# Patient Record
Sex: Male | Born: 1995 | Hispanic: Yes | Marital: Single | Smoking: Current every day smoker
Health system: Southern US, Community
[De-identification: ages and names within clinical notes are randomized; demographics above are authoritative.]

## PROBLEM LIST (undated history)

## (undated) DIAGNOSIS — J45909 Unspecified asthma, uncomplicated: Secondary | ICD-10-CM

## (undated) HISTORY — PX: TONSILLECTOMY: SUR1361

---
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
## 2017-04-26 ENCOUNTER — Encounter (HOSPITAL_COMMUNITY): Payer: Self-pay | Admitting: Emergency Medicine

## 2017-04-26 DIAGNOSIS — L03211 Cellulitis of face: Secondary | ICD-10-CM | POA: Insufficient documentation

## 2017-04-26 DIAGNOSIS — J45909 Unspecified asthma, uncomplicated: Secondary | ICD-10-CM | POA: Diagnosis not present

## 2017-04-26 DIAGNOSIS — F17200 Nicotine dependence, unspecified, uncomplicated: Secondary | ICD-10-CM | POA: Insufficient documentation

## 2017-04-26 DIAGNOSIS — R220 Localized swelling, mass and lump, head: Secondary | ICD-10-CM | POA: Diagnosis present

## 2017-04-26 NOTE — ED Triage Notes (Signed)
Pt from home with c/o swelling below left eye. Pt states swelling began Friday evening. Pt initially thought he was bitten by a mosquito. Pt denies changes in vision, fever , chills, or SOB. Pt denies pain or itching.

## 2017-04-27 ENCOUNTER — Emergency Department (HOSPITAL_COMMUNITY)
Admission: EM | Admit: 2017-04-27 | Discharge: 2017-04-27 | Disposition: A | Discharge status: Home / Self Care | Payer: Medicaid Other | Attending: Emergency Medicine | Admitting: Emergency Medicine

## 2017-04-27 DIAGNOSIS — L03211 Cellulitis of face: Secondary | ICD-10-CM

## 2017-04-27 DIAGNOSIS — R220 Localized swelling, mass and lump, head: Secondary | ICD-10-CM

## 2017-04-27 HISTORY — DX: Unspecified asthma, uncomplicated: J45.909

## 2017-04-27 MED ORDER — TETANUS-DIPHTH-ACELL PERTUSSIS 5-2.5-18.5 LF-MCG/0.5 IM SUSP
0.5000 mL | Freq: Once | INTRAMUSCULAR | Status: AC
  Administered 2017-04-27: 0.5 mL via INTRAMUSCULAR
  Filled 2017-04-27: qty 0.5

## 2017-04-27 MED ORDER — SULFAMETHOXAZOLE-TRIMETHOPRIM 800-160 MG PO TABS: 1.0000 | ORAL_TABLET | Freq: Two times a day (BID) | ORAL | 0 refills | Status: AC

## 2017-04-27 MED ORDER — SULFAMETHOXAZOLE-TRIMETHOPRIM 800-160 MG PO TABS
1.0000 | ORAL_TABLET | Freq: Once | ORAL | Status: AC
  Administered 2017-04-27: 1 via ORAL
  Filled 2017-04-27: qty 1

## 2017-04-27 NOTE — ED Provider Notes (Signed)
[facility]-EMERGENCY DEPT Provider Note   CSN: [id number] Arrival date & time: 04/26/17  2258     History   Chief Complaint Chief Complaint  Patient presents with  . Facial Swelling    HPI James Steele is a 21 y.o. male with a hx of asthma presents to the Emergency Department complaining of gradual, persistent, progressively worsening Swelling and erythema under the left eye onset 3 days ago. Patient reports that he was outside and thought he had been bitten by a bug. He reports gradual increase in the symptoms. No fevers or chills, no pain with EOMs, no blurred vision or diplopia. Patient reports the swelling is localized to under the left eye does not extend around the lids. Patient denies history of IV drug use, immunocompromise or diabetes. He denies history of recurrent infections or MRSA. No treatments prior to arrival. Nothing seems to make the symptoms better or worse.  The history is provided by the patient and medical records. No language interpreter was used.    Past Medical History:  Diagnosis Date  . Asthma     There are no active problems to display for this patient.   Past Surgical History:  Procedure Laterality Date  . TONSILLECTOMY         Home Medications    Prior to Admission medications   Medication Sig Start Date End Date Taking? Authorizing Provider  sulfamethoxazole-trimethoprim (BACTRIM DS,SEPTRA DS) 800-160 MG tablet Take 1 tablet by mouth 2 (two) times daily. 04/27/17 05/04/17  Ardine Iacovelli, Dahlia ClientHannah, PA-C    Family History No family history on file.  Social History Social History  Substance Use Topics  . Smoking status: Current Every Day Smoker  . Smokeless tobacco: Never Used     Comment: 1-2 cigarettes/day  . Alcohol use No     Allergies   Mango flavor   Review of Systems Review of Systems  Constitutional: Negative for chills and fever.  Gastrointestinal: Negative for nausea and vomiting.  Endocrine: Negative for polydipsia,  polyphagia and polyuria.  Skin: Positive for color change.  Allergic/Immunologic: Negative for immunocompromised state.  Hematological: Does not bruise/bleed easily.  Psychiatric/Behavioral: The patient is not nervous/anxious.      Physical Exam Updated Vital Signs BP (!) 142/69 (BP Location: Left Arm)   Pulse 85   Temp 99 F (37.2 C) (Oral)   Resp 16   Ht 6' (1.829 m)   Wt 90.7 kg (200 lb)   SpO2 98%   BMI 27.12 kg/m   Physical Exam  Constitutional: He is oriented to person, place, and time. He appears well-developed and well-nourished. No distress.  HENT:  Head: Normocephalic and atraumatic.  Mild edema, and erythema in the preseptal skin of the lower eyelid and face.  Scratch noted to the area with a small amount of induration at the site of the wound.    Eyes: Conjunctivae and EOM are normal. Pupils are equal, round, and reactive to light. Right conjunctiva is not injected. Left conjunctiva is not injected. No scleral icterus.  No pain with EOMs or diplopia with EOMs  Neck: Normal range of motion.  Several scratches to the left neck  Cardiovascular: Normal rate, regular rhythm and intact distal pulses.   Pulmonary/Chest: Effort normal and breath sounds normal.  Abdominal: Soft. He exhibits no distension. There is no tenderness.  Lymphadenopathy:    He has no cervical adenopathy.  Neurological: He is alert and oriented to person, place, and time.  Skin: Skin is warm and dry. He is  not diaphoretic. There is erythema.  Psychiatric: He has a normal mood and affect.  Nursing note and vitals reviewed.    ED Treatments / Results   Procedures Procedures (including critical care time)  Medications Ordered in ED Medications  Tdap (BOOSTRIX) injection 0.5 mL (not administered)  sulfamethoxazole-trimethoprim (BACTRIM DS,SEPTRA DS) 800-160 MG per tablet 1 tablet (not administered)     Initial Impression / Assessment and Plan / ED Course  I have reviewed the triage vital  signs and the nursing notes.  Pertinent labs & imaging results that were available during my care of the patient were reviewed by me and considered in my medical decision making (see chart for details).     Patient presents with swelling and erythema to the left lower eyelid. Patient reports he was bitten by a bug however there are several scratches to the left side of his neck that appeared to have been inflicted by fingernails. Patient denies this when specifically asked. Small wound under the left eye is more consistent with fentanyl scratch then insect bite. Mild erythema and edema to the site with beginning signs of cellulitis and inflammatory changes. No fluctuance or evidence of abscess. Patient without involvement of the globe. Patient will be given Bactrim, tetanus updated. Patient instructed to return immediately to the emergency department if symptoms progress, he develops fever or other concerns.  Patient and significant other in the room state understanding and are in agreement with the plan.  Final Clinical Impressions(s) / ED Diagnoses   Final diagnoses:  Facial swelling  Facial cellulitis    New Prescriptions New Prescriptions   SULFAMETHOXAZOLE-TRIMETHOPRIM (BACTRIM DS,SEPTRA DS) 800-160 MG TABLET    Take 1 tablet by mouth 2 (two) times daily.     Imani Fiebelkorn, Boyd Kerbs 04/27/17 0243    Ward, Layla Maw, DO 04/27/17 [phone]

## 2017-04-27 NOTE — ED Notes (Signed)
Bed: WA04 Expected date:  Expected time:  Means of arrival:  Comments: 

## 2017-04-27 NOTE — Discharge Instructions (Signed)
1. Medications: bactrim, usual home medications 2. Treatment: rest, drink plenty of fluids, use warm compresses, wash with warm soap and water several times per day 3. Follow Up: Please followup with your primary doctor in 2-3 days for discussion of your diagnoses and further evaluation after today's visit; if you do not have a primary care doctor use the resource guide provided to find one; Please return to the ER for fevers, chills, nausea, vomiting or other signs of infection

## 2017-07-20 ENCOUNTER — Encounter (HOSPITAL_COMMUNITY): Payer: Self-pay | Admitting: Emergency Medicine

## 2017-07-20 ENCOUNTER — Emergency Department (HOSPITAL_COMMUNITY)
Admission: EM | Admit: 2017-07-20 | Discharge: 2017-07-20 | Disposition: A | Discharge status: Home / Self Care | Payer: Medicaid Other | Attending: Emergency Medicine | Admitting: Emergency Medicine

## 2017-07-20 DIAGNOSIS — R1013 Epigastric pain: Secondary | ICD-10-CM | POA: Diagnosis not present

## 2017-07-20 DIAGNOSIS — F17210 Nicotine dependence, cigarettes, uncomplicated: Secondary | ICD-10-CM | POA: Diagnosis not present

## 2017-07-20 DIAGNOSIS — R112 Nausea with vomiting, unspecified: Secondary | ICD-10-CM | POA: Diagnosis present

## 2017-07-20 LAB — COMPREHENSIVE METABOLIC PANEL
ALK PHOS: 62 U/L (ref 38–126)
ALT: 50 U/L (ref 17–63)
AST: 31 U/L (ref 15–41)
Albumin: 4.4 g/dL (ref 3.5–5.0)
Anion gap: 9 (ref 5–15)
BILIRUBIN TOTAL: 0.4 mg/dL (ref 0.3–1.2)
BUN: 13 mg/dL (ref 6–20)
CALCIUM: 9.5 mg/dL (ref 8.9–10.3)
CO2: 25 mmol/L (ref 22–32)
CREATININE: 0.61 mg/dL (ref 0.61–1.24)
Chloride: 107 mmol/L (ref 101–111)
GFR calc Af Amer: >60 mL/min (ref >60)
Glucose, Bld: 100 mg/dL — ABNORMAL HIGH (ref 65–99)
Potassium: 4.1 mmol/L (ref 3.5–5.1)
Sodium: 141 mmol/L (ref 135–145)
TOTAL PROTEIN: 8 g/dL (ref 6.5–8.1)

## 2017-07-20 LAB — URINALYSIS, ROUTINE W REFLEX MICROSCOPIC
Bilirubin Urine: NEGATIVE
Glucose, UA: NEGATIVE mg/dL
Ketones, ur: NEGATIVE mg/dL
Leukocytes, UA: NEGATIVE
Nitrite: NEGATIVE
Protein, ur: NEGATIVE mg/dL
Specific Gravity, Urine: 1.02 (ref 1.005–1.030)
pH: 7 (ref 5.0–8.0)

## 2017-07-20 LAB — URINALYSIS, MICROSCOPIC (REFLEX)

## 2017-07-20 LAB — LIPASE, BLOOD: Lipase: 22 U/L (ref 11–51)

## 2017-07-20 LAB — CBC
HCT: 43.2 % (ref 39.0–52.0)
Hemoglobin: 15 g/dL (ref 13.0–17.0)
MCH: 31.8 pg (ref 26.0–34.0)
MCHC: 34.7 g/dL (ref 30.0–36.0)
MCV: 91.7 fL (ref 78.0–100.0)
Platelets: 164 K/uL (ref 150–400)
RBC: 4.71 MIL/uL (ref 4.22–5.81)
RDW: 12.8 % (ref 11.5–15.5)
WBC: 10 K/uL (ref 4.0–10.5)

## 2017-07-20 MED ORDER — ONDANSETRON HCL 4 MG PO TABS
4.0000 mg | ORAL_TABLET | Freq: Once | ORAL | Status: AC
  Administered 2017-07-20: 4 mg via ORAL
  Filled 2017-07-20: qty 1

## 2017-07-20 MED ORDER — GI COCKTAIL ~~LOC~~
30.0000 mL | Freq: Once | ORAL | Status: AC
  Administered 2017-07-20: 30 mL via ORAL
  Filled 2017-07-20: qty 30

## 2017-07-20 MED ORDER — OMEPRAZOLE 20 MG PO CPDR: 20.0000 mg | DELAYED_RELEASE_CAPSULE | Freq: Every day | ORAL | 0 refills | Status: AC

## 2017-07-20 MED ORDER — ONDANSETRON HCL 4 MG PO TABS: 4.0000 mg | ORAL_TABLET | Freq: Three times a day (TID) | ORAL | 0 refills | Status: AC | PRN

## 2017-07-20 NOTE — Discharge Instructions (Signed)
Our workup today was reassuring. We do not feel you have a serious infection or abdominal injury. We suspect you have reflux and gastritis causing her symptoms. We are giving a prescription for Prilosec and a prescription for nausea medication. Please follow-up with a primary care physician for further management. If any symptoms change or worsen, please return to the nearest emergency department.

## 2017-07-20 NOTE — ED Triage Notes (Signed)
Per pt, states he woke up with mid, lower abdominal pain and then started vomiting-no pain at present, no vomiting at this time

## 2017-07-20 NOTE — ED Provider Notes (Signed)
[facility]-EMERGENCY DEPT Provider Note   CSN: [id number] Arrival date & time: 07/20/17  0741     History   Chief Complaint Chief Complaint  Patient presents with  . Emesis    HPI [name] is a 21 y.o. male.  The history is provided by the patient, a significant other and medical records. No language interpreter was used.  Emesis   This is a chronic problem. The current episode started more than 1 week ago (several months). The problem occurs 2 to 4 times per day. The problem has been resolved. The emesis has an appearance of stomach contents. There has been no fever. Associated symptoms include abdominal pain. Pertinent negatives include no arthralgias, no chills, no cough, no diarrhea, no fever, no headaches, no myalgias, no sweats and no URI.  Abdominal Pain   This is a chronic problem. The current episode started more than 1 week ago (several months, every AM mild). The problem occurs daily. The problem has been resolved. Associated with: waking up and with BM occasionally. The pain is located in the LUQ. The quality of the pain is cramping. The pain is moderate. Associated symptoms include nausea, vomiting and constipation. Pertinent negatives include anorexia, fever, diarrhea, melena, dysuria, frequency, hematuria, headaches, arthralgias and myalgias. The symptoms are aggravated by bowel movements. Nothing relieves the symptoms. Past medical history comments: hx of "gastritis".    Past Medical History:  Diagnosis Date  . Asthma     There are no active problems to display for this patient.   Past Surgical History:  Procedure Laterality Date  . TONSILLECTOMY         Home Medications    Prior to Admission medications   Not on File    Family History No family history on file.  Social History Social History  Substance Use Topics  . Smoking status: Current Every Day Smoker  . Smokeless tobacco: Never Used     Comment: 1-2 cigarettes/day  . Alcohol use No      Allergies   Mango flavor   Review of Systems Review of Systems  Constitutional: Negative for activity change, appetite change, chills, diaphoresis, fatigue and fever.  HENT: Negative for congestion.   Respiratory: Negative for cough, chest tightness, shortness of breath and wheezing.   Cardiovascular: Negative for chest pain and palpitations.  Gastrointestinal: Positive for abdominal pain, constipation, nausea and vomiting. Negative for abdominal distention, anal bleeding, anorexia, blood in stool, diarrhea and melena.  Genitourinary: Negative for difficulty urinating, discharge, dysuria, flank pain, frequency, hematuria, penile pain, penile swelling, scrotal swelling and testicular pain.  Musculoskeletal: Negative for arthralgias, back pain and myalgias.  Skin: Negative for rash and wound.  Neurological: Negative for weakness, light-headedness, numbness and headaches.  Psychiatric/Behavioral: Negative for agitation and confusion.  All other systems reviewed and are negative.    Physical Exam Updated Vital Signs BP 136/85 (BP Location: Right Arm)   Pulse 64   Temp 97.8 F (36.6 C) (Oral)   Resp 16   Ht 6' (1.829 m)   Wt 90.7 kg (200 lb)   SpO2 99%   BMI 27.12 kg/m   Physical Exam  Constitutional: He is oriented to person, place, and time. He appears well-developed and well-nourished.  HENT:  Head: Normocephalic and atraumatic.  Mouth/Throat: Oropharynx is clear and moist. No oropharyngeal exudate.  Eyes: Pupils are equal, round, and reactive to light. Conjunctivae and EOM are normal.  Neck: Normal range of motion. Neck supple.  Cardiovascular: Normal rate, regular rhythm and  intact distal pulses.   No murmur heard. Pulmonary/Chest: Effort normal and breath sounds normal. No stridor. No respiratory distress. He has no wheezes. He exhibits no tenderness.  Abdominal: Soft. He exhibits no distension. There is no tenderness. There is no rebound and no guarding.    Musculoskeletal: He exhibits no edema.  Neurological: He is alert and oriented to person, place, and time. No cranial nerve deficit or sensory deficit. He exhibits normal muscle tone.  Skin: Skin is warm and dry. Capillary refill takes less than 2 seconds. No erythema. No pallor.  Psychiatric: He has a normal mood and affect.  Nursing note and vitals reviewed.    ED Treatments / Results  Labs (all labs ordered are listed, but only abnormal results are displayed) Labs Reviewed  COMPREHENSIVE METABOLIC PANEL - Abnormal; Notable for the following:       Result Value   Glucose, Bld 100 (*)    All other components within normal limits  URINALYSIS, ROUTINE W REFLEX MICROSCOPIC - Abnormal; Notable for the following:    Hgb urine dipstick MODERATE (*)    All other components within normal limits  URINALYSIS, MICROSCOPIC (REFLEX) - Abnormal; Notable for the following:    Bacteria, UA FEW (*)    Squamous Epithelial / LPF 0-5 (*)    All other components within normal limits  LIPASE, BLOOD  CBC    EKG  EKG Interpretation None       Radiology No results found.  Procedures Procedures (including critical care time)  Medications Ordered in ED Medications  gi cocktail (Maalox,Lidocaine,Donnatal) (30 mLs Oral Given 07/20/17 0916)  ondansetron (ZOFRAN) tablet 4 mg (4 mg Oral Given 07/20/17 0916)     Initial Impression / Assessment and Plan / ED Course  I have reviewed the triage vital signs and the nursing notes.  Pertinent labs & imaging results that were available during my care of the patient were reviewed by me and considered in my medical decision making (see chart for details).     James Steele is a 21 y.o. male With a past medical history significant for asthma and gastritis who presents with several months of morning nausea, vomiting, and abdominal cramping. Patient says that nearly every morning for the last several months he wakes up and has nausea. Occasionally  has emesis as he did this morning. He also normally has cramping in his left upper quadrant and abdomen. He says he has had no recent abdominal traumas. He denies any fevers, chills, respiratory symptoms, chest pain. He denies any diarrhea but does report some mild constipation chronically. He reports normal bowel movement recently. He says that he has no difficulty with urination. He denies any headache, vision changes, or any neurologic deficits. He says that his symptoms resolve when he starts his day.He says he is currently having no abdominal pain and says that it lasts for several minutes in the mornings. He reports mild nausea currently. He also says that he has had noneurologic deficits or headaches with it. He denies other symptoms.  On exam, no focal neurologic deficits present. No neck stiffness or neck tenderness. No abdominal tenderness present on exam. No flank tenderness or back tenderness. Lungs clear and chest nontender. No abnormality is on my exam.  Based on symptoms and description of symptoms, suspect a reflux or gastritis causing his symptoms. With no neurologic deficits or headaches, doubt intracranial cause of the daily morning nausea and occasional vomiting.Patient says he is nearly symptom-free currently with mild nausea. He'll  be given a GI cocktail and nausea medicine. Patient will have screening laboratory testing to rule out a liver or pancreatic etiology of his symptoms.   Anticipate discharge if laboratory testing is reassuring and symptoms continue to improve.  Patient's laboratory testing reassuring. Moderate hemoglobin in urine. Patient felt better after nausea medicine and GI cocktail. Suspect reflux as primary etiology of symptoms.   Patient will follow-up with PCP and be started on Prilosec. Patient also given prescriptions for Zofran. Patient understood return precautions. Patient discharged in good condition with improving symptoms.   Final Clinical Impressions(s)  / ED Diagnoses   Final diagnoses:  Nausea and vomiting, intractability of vomiting not specified, unspecified vomiting type  Epigastric abdominal pain    New Prescriptions New Prescriptions   OMEPRAZOLE (PRILOSEC) 20 MG CAPSULE    Take 1 capsule (20 mg total) by mouth daily.   ONDANSETRON (ZOFRAN) 4 MG TABLET    Take 1 tablet (4 mg total) by mouth every 8 (eight) hours as needed for nausea or vomiting.    Clinical Impression: 1. Nausea and vomiting, intractability of vomiting not specified, unspecified vomiting type   2. Epigastric abdominal pain     Disposition: Discharge  Condition: Good  I have discussed the results, Dx and Tx plan with the pt(& family if present). He/she/they expressed understanding and agree(s) with the plan. Discharge instructions discussed at great length. Strict return precautions discussed and pt &/or family have verbalized understanding of the instructions. No further questions at time of discharge.    New Prescriptions   OMEPRAZOLE (PRILOSEC) 20 MG CAPSULE    Take 1 capsule (20 mg total) by mouth daily.   ONDANSETRON (ZOFRAN) 4 MG TABLET    Take 1 tablet (4 mg total) by mouth every 8 (eight) hours as needed for nausea or vomiting.    Follow Up: [facility] [address] [place] [address] [phone] Schedule an appointment as soon as possible for a visit    [facility] [address] [phone] mc [place] [address] [phone]  If symptoms worsen     Yousuf Ager, Canary Brim, MD 07/20/17 [phone]
